# Patient Record
Sex: Female | Born: 2000 | Hispanic: Yes | Marital: Single | Smoking: Never smoker
Health system: Southern US, Community
[De-identification: ages and names within clinical notes are randomized; demographics above are authoritative.]

## PROBLEM LIST (undated history)

## (undated) DIAGNOSIS — Z00129 Encounter for routine child health examination without abnormal findings: Principal | ICD-10-CM

## (undated) HISTORY — DX: Encounter for routine child health examination without abnormal findings: Z00.129

---
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
## 2021-01-04 ENCOUNTER — Ambulatory Visit (INDEPENDENT_AMBULATORY_CARE_PROVIDER_SITE_OTHER): Payer: BC Managed Care – PPO | Admitting: Family

## 2021-01-04 ENCOUNTER — Other Ambulatory Visit: Payer: Self-pay

## 2021-01-04 VITALS — BP 96/56 | HR 61 | Temp 98.9°F | Ht 61.5 in | Wt 93.4 lb

## 2021-01-04 DIAGNOSIS — R109 Unspecified abdominal pain: Secondary | ICD-10-CM | POA: Diagnosis not present

## 2021-01-04 DIAGNOSIS — R636 Underweight: Secondary | ICD-10-CM | POA: Diagnosis not present

## 2021-01-04 DIAGNOSIS — Z124 Encounter for screening for malignant neoplasm of cervix: Secondary | ICD-10-CM | POA: Diagnosis not present

## 2021-01-04 NOTE — Progress Notes (Signed)
Chelsey Harvey is a 20 y.o. female with the following history as recorded in EpicCare:  Patient Active Problem List   Diagnosis Date Noted   Scoliosis 12/03/2017   Skin lesion of back 12/03/2017   Right foot pain 12/03/2017   Pharyngitis 12/03/2017   [place] (well child check) 11/09/2013    Current Outpatient Medications  Medication Sig Dispense Refill   busPIRone (BUSPAR) 5 MG tablet Take 5 mg by mouth 3 (three) times daily.     QUEtiapine (SEROQUEL) 50 MG tablet Take 50 mg by mouth at bedtime.     No current facility-administered medications for this visit.    Allergies: Patient has no known allergies.  Past Medical History:  Diagnosis Date   [facility] (well child check) 11/09/2013    No past surgical history on file.  Family History  Problem Relation Age of Onset   Hypertension Father    GER disease Mother    Hypertension Maternal Grandfather    Diabetes Maternal Grandfather        type 2   Hypertension Paternal Grandmother    Arthritis Maternal Grandmother     Social History   Tobacco Use   Smoking status: Never   Smokeless tobacco: Never  Substance Use Topics   Alcohol use: No    Subjective:  Patient presents with concerns for intermittent abdominal pain/ nausea "on and off" x 2-3 months; can have episodes of vomiting prior to go to the bathroom; readily admits that does she does have underlying anxiety/ stress; was previously working with therapist until recently due to financial issues; Also notes she is under the care of psychiatrist for management of anxiety but has not seen her psychiatrist recently;      Objective:  Vitals:   01/04/21 1517  BP: (!) 96/56  Pulse: 61  Temp: 98.9 F (37.2 C)  TempSrc: Oral  SpO2: 98%  Weight: 93 lb 6.4 oz (42.4 kg)  Height: 5' 1.5" (1.562 m)    General: Well developed, well nourished, in no acute distress  Skin : Warm and dry.  Head: Normocephalic and atraumatic  Eyes: Sclera and conjunctiva clear; pupils round and reactive  to light; extraocular movements intact  Ears: External normal; canals clear; tympanic membranes normal  Oropharynx: Pink, supple. No suspicious lesions  Neck: Supple without thyromegaly, adenopathy  Lungs: Respirations unlabored; clear to auscultation bilaterally without wheeze, rales, rhonchi  CVS exam: normal rate and regular rhythm.  Abdomen: Soft; nontender; nondistended; normoactive bowel sounds; no masses or hepatosplenomegaly  Neurologic: Alert and oriented; speech intact; face symmetrical; moves all extremities well; CNII-XII intact without focal deficit   Assessment:  1. Underweight   2. Abdominal pain, unspecified abdominal location   3. Cervical cancer screening     Plan:  Per patient, she has always been underweight; would be open to meeting with nutritionist to discuss healthy eating habits; Suspect anxiety component; will update labs and abdominal ultrasound; will refer to GI to discuss possible absorption issues and encouraged to see her psychiatrist to discuss medication management/ resume therapy; she is not suicidal; Refer to GYN to establish care/ discuss contraception;  Time spent 30 minutes discussing symptoms and treatment plan  This visit occurred during the SARS-CoV-2 public health emergency.  Safety protocols were in place, including screening questions prior to the visit, additional usage of staff PPE, and extensive cleaning of exam room while observing appropriate contact time as indicated for disinfecting solutions.    No follow-ups on file.  Orders Placed This Encounter  Procedures   US Abdomen Complete    Standing Status:   Future    Standing Expiration Date:   01/04/2022    Order Specific Question:   Reason for Exam (SYMPTOM  OR DIAGNOSIS REQUIRED)    Answer:   abdominal pain    Order Specific Question:   Preferred imaging location?    Answer:   [facility]   CBC with Differential/Platelet   Comp Met (CMET)   TSH   Ambulatory referral to  Nutrition and Diabetic Education    Referral Priority:   Routine    Referral Type:   Consultation    Referral Reason:   Specialty Services Required    Number of Visits Requested:   1   Ambulatory referral to Gastroenterology    Referral Priority:   Routine    Referral Type:   Consultation    Referral Reason:   Specialty Services Required    Number of Visits Requested:   1   Ambulatory referral to Gynecology    Referral Priority:   Routine    Referral Type:   Consultation    Referral Reason:   Specialty Services Required    Requested Specialty:   Gynecology    Number of Visits Requested:   1    Requested Prescriptions    No prescriptions requested or ordered in this encounter

## 2021-01-12 NOTE — Progress Notes (Deleted)
[facility] [address] [place], [place] [address] [phone]  Date:  01/19/2021   Name:  Chelsey Harvey   DOB:  September 02, 2000   MRN:  [id number]  PCP:  Bradd Canary, MD    Chief Complaint: No chief complaint on file.   History of Present Illness:  Chelsey Harvey is a 20 y.o. very pleasant female patient who presents with the following:  Pt seen today with concern of "stomachache" Primary pt of Dr Abner Greenspan- I last saw her in February of this year Seen by Ria Clock 6/28: Patient presents with concerns for intermittent abdominal pain/ nausea "on and off" x 2-3 months; can have episodes of vomiting prior to go to the bathroom; readily admits that does she does have underlying anxiety/ stress; was previously working with therapist until recently due to financial issues; Also notes she is under the care of psychiatrist for management of anxiety but has not seen her psychiatrist recently . Underweight  2. Abdominal pain, unspecified abdominal location  3. Cervical cancer screening   Plan:  Per patient, she has always been underweight; would be open to meeting with nutritionist to discuss healthy eating habits; Suspect anxiety component; will update labs and abdominal ultrasound; will refer to GI to discuss possible absorption issues and encouraged to see her psychiatrist to discuss medication management/ resume therapy; she is not suicidal; Refer to GYN to establish care/ discuss contraception  Her primary care doctor had also requested a thyroid, CMP and CBC-we can certainly collect these today Patient Active Problem List   Diagnosis Date Noted   Scoliosis 12/03/2017   Skin lesion of back 12/03/2017   Right foot pain 12/03/2017   Pharyngitis 12/03/2017   [facility] (well child check) 11/09/2013    Past Medical History:  Diagnosis Date   [facility] (well child check) 11/09/2013    No past surgical history on file.  Social  History   Tobacco Use   Smoking status: Never   Smokeless tobacco: Never  Substance Use Topics   Alcohol use: No   Drug use: No    Family History  Problem Relation Age of Onset   Hypertension Father    GER disease Mother    Hypertension Maternal Grandfather    Diabetes Maternal Grandfather        type 2   Hypertension Paternal Grandmother    Arthritis Maternal Grandmother     No Known Allergies  Medication list has been reviewed and updated.  Current Outpatient Medications on File Prior to Visit  Medication Sig Dispense Refill   busPIRone (BUSPAR) 5 MG tablet Take 5 mg by mouth 3 (three) times daily.     QUEtiapine (SEROQUEL) 50 MG tablet Take 50 mg by mouth at bedtime.     No current facility-administered medications on file prior to visit.    Review of Systems: As per HPI- otherwise negative.    Physical Examination: There were no vitals filed for this visit. There were no vitals filed for this visit. There is no height or weight on file to calculate BMI. Ideal Body Weight:    GEN: no acute distress. HEENT: Atraumatic, Normocephalic.  Ears and Nose: No external deformity. CV: RRR, No M/G/R. No JVD. No thrill. No extra heart sounds. PULM: CTA B, no wheezes, crackles, rhonchi. No retractions. No resp. distress. No accessory muscle use. ABD: S, NT, ND, +BS. No rebound. No HSM. EXTR: No c/c/e PSYCH: Normally interactive. Conversant.  Assessment and Plan: ***  This visit occurred during the SARS-CoV-2 public health emergency.  Safety protocols were in place, including screening questions prior to the visit, additional usage of staff PPE, and extensive cleaning of exam room while observing appropriate contact time as indicated for disinfecting solutions.   Signed Abbe Amsterdam, MD

## 2021-01-19 ENCOUNTER — Ambulatory Visit: Payer: BC Managed Care – PPO | Admitting: Family Medicine

## 2022-10-06 ENCOUNTER — Ambulatory Visit
Admission: EM | Admit: 2022-10-06 | Discharge: 2022-10-06 | Disposition: A | Discharge status: Home / Self Care | Payer: 59 | Attending: Urgent Care | Admitting: Urgent Care

## 2022-10-06 ENCOUNTER — Ambulatory Visit (INDEPENDENT_AMBULATORY_CARE_PROVIDER_SITE_OTHER): Payer: 59

## 2022-10-06 DIAGNOSIS — R6884 Jaw pain: Secondary | ICD-10-CM | POA: Diagnosis not present

## 2022-10-06 DIAGNOSIS — S20212A Contusion of left front wall of thorax, initial encounter: Secondary | ICD-10-CM

## 2022-10-06 DIAGNOSIS — R0781 Pleurodynia: Secondary | ICD-10-CM

## 2022-10-06 DIAGNOSIS — R0782 Intercostal pain: Secondary | ICD-10-CM

## 2022-10-06 DIAGNOSIS — W19XXXA Unspecified fall, initial encounter: Secondary | ICD-10-CM | POA: Diagnosis not present

## 2022-10-06 MED ORDER — CYCLOBENZAPRINE HCL 5 MG PO TABS: 5.0000 mg | ORAL_TABLET | Freq: Every evening | ORAL | 0 refills | Status: DC | PRN

## 2022-10-06 MED ORDER — NAPROXEN 375 MG PO TABS: 375.0000 mg | ORAL_TABLET | Freq: Two times a day (BID) | ORAL | 0 refills | Status: AC

## 2022-10-06 NOTE — ED Triage Notes (Signed)
Pt reports jaw feels out of place and left sided rib cage pain since last night when she tripped and fell over a futon. Pain is worse with movement, deep breath, cough.

## 2022-10-06 NOTE — ED Provider Notes (Signed)
[facility]  Note:  This document was prepared using Systems analyst and may include unintentional dictation errors.  MRN: [id number] DOB: [birth year]  Subjective:   Chelsey Harvey is a 22 y.o. female presenting for suffering a fall last night.  Patient ended up tripping and breaking her fall with the left side of her ribs and made impact against her jaw as well.  Pain is worse when she takes a deep breath or coughs.  Twisting motions also elicit pain.  No swelling, bony deformity, bruising.  No head injury, loss conscious.  No current facility-administered medications for this encounter.  Current Outpatient Medications:    busPIRone (BUSPAR) 5 MG tablet, Take 5 mg by mouth 3 (three) times daily., Disp: , Rfl:    QUEtiapine (SEROQUEL) 50 MG tablet, Take 50 mg by mouth at bedtime., Disp: , Rfl:    No Known Allergies  Past Medical History:  Diagnosis Date   [facility] (well child check) 11/09/2013     History reviewed. No pertinent surgical history.  Family History  Problem Relation Age of Onset   Hypertension Father    GER disease Mother    Hypertension Maternal Grandfather    Diabetes Maternal Grandfather        type 2   Hypertension Paternal Grandmother    Arthritis Maternal Grandmother     Social History   Tobacco Use   Smoking status: Never   Smokeless tobacco: Never  Substance Use Topics   Alcohol use: No   Drug use: No    ROS   Objective:   Vitals: BP 119/75 (BP Location: Left Arm)   Pulse 87   Temp 99 F (37.2 C) (Oral)   Resp 16   LMP  (Within Weeks) Comment: 2 weeks  SpO2 98%   Physical Exam Constitutional:      General: She is not in acute distress.    Appearance: Normal appearance. She is well-developed and normal weight. She is not ill-appearing, toxic-appearing or diaphoretic.  HENT:     Head: Normocephalic and atraumatic.     Right Ear: Tympanic membrane, ear canal and external ear normal. No drainage or  tenderness. No middle ear effusion. There is no impacted cerumen. Tympanic membrane is not erythematous or bulging.     Left Ear: Tympanic membrane, ear canal and external ear normal. No drainage or tenderness.  No middle ear effusion. There is no impacted cerumen. Tympanic membrane is not erythematous or bulging.     Nose: Nose normal. No congestion or rhinorrhea.     Mouth/Throat:     Mouth: Mucous membranes are moist. No oral lesions.     Pharynx: No pharyngeal swelling, oropharyngeal exudate, posterior oropharyngeal erythema or uvula swelling.     Tonsils: No tonsillar exudate or tonsillar abscesses.  Eyes:     General: No scleral icterus.       Right eye: No discharge.        Left eye: No discharge.     Extraocular Movements: Extraocular movements intact.     Right eye: Normal extraocular motion.     Left eye: Normal extraocular motion.     Conjunctiva/sclera: Conjunctivae normal.  Cardiovascular:     Rate and Rhythm: Normal rate and regular rhythm.     Heart sounds: Normal heart sounds. No murmur heard.    No friction rub. No gallop.  Pulmonary:     Effort: Pulmonary effort is normal. No respiratory distress.     Breath sounds:  No stridor. No wheezing, rhonchi or rales.  Chest:     Chest wall: Tenderness (left lateral lower ribs) present.  Musculoskeletal:     Cervical back: Normal range of motion and neck supple.  Lymphadenopathy:     Cervical: No cervical adenopathy.  Skin:    General: Skin is warm and dry.  Neurological:     General: No focal deficit present.     Mental Status: She is alert and oriented to person, place, and time.  Psychiatric:        Mood and Affect: Mood normal.        Behavior: Behavior normal.     DG Ribs Unilateral W/Chest Left  Result Date: 10/06/2022 CLINICAL DATA:  Left rib pain after fall EXAM: LEFT RIBS AND CHEST - 3+ VIEW COMPARISON:  None Available. FINDINGS: No displaced fracture or other bone lesions are seen involving the ribs. Minimal  S-shaped curvature of the thoracolumbar spine. There is no evidence of pneumothorax or pleural effusion. Both lungs are clear. Heart size and mediastinal contours are within normal limits. IMPRESSION: No displaced rib fracture. Electronically Signed   By: Merilyn Baba M.D.   On: 10/06/2022 14:44     Assessment and Plan :   PDMP not reviewed this encounter.  1. Rib contusion, left, initial encounter   2. Rib pain on left side   3. Jaw pain     Recommended conservative management for left rib contusion, jaw contusion.  Recommended deferring ER visit for CT of the mandible given low suspicion for an acute mandibular fracture or dislocation.  Naproxen for pain and inflammation, Flexeril as a muscle relaxant. Counseled patient on potential for adverse effects with medications prescribed/recommended today, ER and return-to-clinic precautions discussed, patient verbalized understanding.    Jaynee Eagles, PA-C 10/06/22 1450

## 2022-11-22 ENCOUNTER — Encounter: Payer: Self-pay | Admitting: Family Medicine

## 2022-11-22 ENCOUNTER — Ambulatory Visit: Payer: 59 | Admitting: Family Medicine

## 2022-11-22 ENCOUNTER — Other Ambulatory Visit (HOSPITAL_COMMUNITY)
Admission: RE | Admit: 2022-11-22 | Discharge: 2022-11-22 | Disposition: A | Discharge status: Home / Self Care | Payer: 59 | Source: Ambulatory Visit | Attending: Family Medicine | Admitting: Family Medicine

## 2022-11-22 VITALS — BP 104/61 | HR 70 | Temp 98.3°F | Ht 61.5 in | Wt 88.0 lb

## 2022-11-22 DIAGNOSIS — R300 Dysuria: Secondary | ICD-10-CM

## 2022-11-22 DIAGNOSIS — N898 Other specified noninflammatory disorders of vagina: Secondary | ICD-10-CM | POA: Diagnosis present

## 2022-11-22 LAB — POC URINALSYSI DIPSTICK (AUTOMATED)
Blood, UA: NEGATIVE
Glucose, UA: NEGATIVE
Nitrite, UA: POSITIVE
Protein, UA: NEGATIVE
Spec Grav, UA: 1.015 (ref 1.010–1.025)
Urobilinogen, UA: 0.2 E.U./dL
pH, UA: 6 (ref 5.0–8.0)

## 2022-11-22 LAB — URINALYSIS, MICROSCOPIC ONLY

## 2022-11-22 MED ORDER — SULFAMETHOXAZOLE-TRIMETHOPRIM 800-160 MG PO TABS: 1.0000 | ORAL_TABLET | Freq: Two times a day (BID) | ORAL | 0 refills | Status: AC

## 2022-11-22 NOTE — Progress Notes (Signed)
Acute Office Visit  Subjective:     Patient ID: Chelsey Harvey, female    DOB: 03-26-2001, 22 y.o.   MRN: [id number]    CC: dysuria and vaginal discharge    HPI   Dysuria and vaginal discharge: Patient complains of  dysuria, hematuria, suprapubic pressure occasionally, some vaginal discharge at first, but seems resolved now  She has had symptoms for almost 2 weeks. Patient denies  back pain, fevers, chills,  . Patient does not have a history of recurrent UTI.  She got some improvement with Azo. She has had some vaginal discharge but no itching. LMP: 11/05/22 She felt like symptoms were getting a little better but then seemed to worsen after her period. Felt like this cycle lasted longer than usual and seemed a little heavier with some clots.  She is sexually with one female, does not consistently use protection. No birth control. Agreeable to STD screening today.      ROS All review of systems negative except what is listed in the HPI      Objective:    BP 104/61   Pulse 70   Temp 98.3 F (36.8 C) (Oral)   Ht 5' 1.5" (1.562 m)   Wt 88 lb (39.9 kg)   SpO2 99%   BMI 16.36 kg/m    Physical Exam Vitals reviewed.  Constitutional:      Appearance: Normal appearance.  Cardiovascular:     Rate and Rhythm: Normal rate and regular rhythm.     Pulses: Normal pulses.     Heart sounds: Normal heart sounds.  Pulmonary:     Effort: Pulmonary effort is normal.     Breath sounds: Normal breath sounds.  Abdominal:     General: There is no distension.     Palpations: There is no mass.     Tenderness: There is no abdominal tenderness. There is no right CVA tenderness, left CVA tenderness, guarding or rebound.  Genitourinary:    Comments: Deferred, self-swab Skin:    General: Skin is warm and dry.  Neurological:     Mental Status: She is alert and oriented to person, place, and time.  Psychiatric:        Mood and Affect: Mood normal.        Behavior: Behavior normal.         Thought Content: Thought content normal.        Judgment: Judgment normal.         Results for orders placed or performed in visit on 11/22/22  POCT Urinalysis Dipstick (Automated)  Result Value Ref Range   Color, UA orange    Clarity, UA clear    Glucose, UA Negative Negative   Bilirubin, UA large    Ketones, UA trace    Spec Grav, UA 1.015 1.010 - 1.025   Blood, UA negative    pH, UA 6.0 5.0 - 8.0   Protein, UA Negative Negative   Urobilinogen, UA 0.2 0.2 or 1.0 E.U./dL   Nitrite, UA positive    Leukocytes, UA Small (1+) (A) Negative        Assessment & Plan:   Problem List Items Addressed This Visit   None Visit Diagnoses     Dysuria    -  Primary   Relevant Medications   sulfamethoxazole-trimethoprim (BACTRIM DS) 800-160 MG tablet   Other Relevant Orders   POCT Urinalysis Dipstick (Automated) (Completed)   Urine Culture   Urine Microscopic Only   Vaginal discharge  Relevant Orders   Cervicovaginal ancillary only      Urine with several abnormalities (Azo can trigger some false positives), but there are leukocytes and nitrites which make [place] suspicious for UTI. I will go and give you some Bactrim (antibiotic). We will send the sample to the lab to further evaluate (microscopic and culture) and we will let you know if we need to make any changes.  Sending off vaginal swab as well - we will let you know results.  Make sure you are staying well hydrated.       Meds ordered this encounter  Medications   sulfamethoxazole-trimethoprim (BACTRIM DS) 800-160 MG tablet    Sig: Take 1 tablet by mouth 2 (two) times daily for 3 days.    Dispense:  6 tablet    Refill:  0    Order Specific Question:   Supervising Provider    Answer:   Danise Edge A [[id number]]    Return if symptoms worsen or fail to improve, for / pending results .  Clayborne Dana, NP

## 2022-11-22 NOTE — Patient Instructions (Addendum)
Urine with several abnormalities (Azo can trigger some false positives), but there are leukocytes and nitrites which make [place] suspicious for UTI. I will go and give you some Bactrim (antibiotic). We will send the sample to the lab to further evaluate (microscopic and culture) and we will let you know if we need to make any changes.  Sending off vaginal swab as well - we will let you know results.  Make sure you are staying well hydrated.

## 2022-11-23 LAB — URINE CULTURE
MICRO NUMBER:: 14959808
SPECIMEN QUALITY:: ADEQUATE

## 2022-11-23 LAB — CERVICOVAGINAL ANCILLARY ONLY
Bacterial Vaginitis (gardnerella): NEGATIVE
Candida Glabrata: NEGATIVE
Candida Vaginitis: NEGATIVE
Chlamydia: NEGATIVE
Comment: NEGATIVE
Comment: NEGATIVE
Comment: NEGATIVE
Comment: NEGATIVE
Comment: NEGATIVE
Comment: NORMAL
Neisseria Gonorrhea: NEGATIVE
Trichomonas: NEGATIVE
